# Patient Record
Sex: Male | Born: 1995 | Race: White | Hispanic: No | Marital: Single | Smoking: Never smoker
Health system: Southern US, Community
[De-identification: ages and names within clinical notes are randomized; demographics above are authoritative.]

## PROBLEM LIST (undated history)

## (undated) DIAGNOSIS — I499 Cardiac arrhythmia, unspecified: Secondary | ICD-10-CM

## (undated) DIAGNOSIS — F909 Attention-deficit hyperactivity disorder, unspecified type: Secondary | ICD-10-CM

## (undated) DIAGNOSIS — K649 Unspecified hemorrhoids: Secondary | ICD-10-CM

## (undated) DIAGNOSIS — S069X9A Unspecified intracranial injury with loss of consciousness of unspecified duration, initial encounter: Secondary | ICD-10-CM

## (undated) DIAGNOSIS — E785 Hyperlipidemia, unspecified: Secondary | ICD-10-CM

## (undated) DIAGNOSIS — Z9689 Presence of other specified functional implants: Secondary | ICD-10-CM

## (undated) DIAGNOSIS — I10 Essential (primary) hypertension: Secondary | ICD-10-CM

## (undated) DIAGNOSIS — F919 Conduct disorder, unspecified: Secondary | ICD-10-CM

## (undated) DIAGNOSIS — F4320 Adjustment disorder, unspecified: Secondary | ICD-10-CM

## (undated) DIAGNOSIS — R569 Unspecified convulsions: Secondary | ICD-10-CM

## (undated) HISTORY — DX: Cardiac arrhythmia, unspecified: I49.9

## (undated) HISTORY — DX: Presence of other specified functional implants: Z96.89

## (undated) HISTORY — DX: Essential (primary) hypertension: I10

## (undated) HISTORY — DX: Conduct disorder, unspecified: F91.9

## (undated) HISTORY — DX: Unspecified intracranial injury with loss of consciousness of unspecified duration, initial encounter: S06.9X9A

## (undated) HISTORY — DX: Adjustment disorder, unspecified: F43.20

## (undated) HISTORY — DX: Hyperlipidemia, unspecified: E78.5

## (undated) HISTORY — DX: Attention-deficit hyperactivity disorder, unspecified type: F90.9

## (undated) HISTORY — DX: Unspecified convulsions: R56.9

## (undated) HISTORY — DX: Unspecified hemorrhoids: K64.9

---
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
## 2016-01-04 ENCOUNTER — Encounter (HOSPITAL_COMMUNITY): Payer: Self-pay | Admitting: Psychiatry

## 2016-01-04 ENCOUNTER — Ambulatory Visit (INDEPENDENT_AMBULATORY_CARE_PROVIDER_SITE_OTHER): Payer: Medicaid Other | Admitting: Psychiatry

## 2016-01-04 VITALS — BP 112/76 | HR 65 | Ht 68.5 in | Wt 155.0 lb

## 2016-01-04 DIAGNOSIS — R569 Unspecified convulsions: Secondary | ICD-10-CM

## 2016-01-04 DIAGNOSIS — F902 Attention-deficit hyperactivity disorder, combined type: Secondary | ICD-10-CM | POA: Diagnosis not present

## 2016-01-04 MED ORDER — OXCARBAZEPINE 300 MG PO TABS: 300.0000 mg | ORAL_TABLET | Freq: Two times a day (BID) | ORAL | Status: DC

## 2016-01-04 MED ORDER — METHYLPHENIDATE HCL ER (OSM) 54 MG PO TBCR: 54.0000 mg | EXTENDED_RELEASE_TABLET | Freq: Every day | ORAL | Status: DC

## 2016-01-04 MED ORDER — CLONIDINE HCL 0.1 MG PO TABS: ORAL_TABLET | ORAL | Status: DC
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
## 2016-01-14 ENCOUNTER — Telehealth (HOSPITAL_COMMUNITY): Payer: Self-pay | Admitting: *Deleted

## 2016-01-14 DIAGNOSIS — F900 Attention-deficit hyperactivity disorder, predominantly inattentive type: Secondary | ICD-10-CM

## 2016-01-14 DIAGNOSIS — F902 Attention-deficit hyperactivity disorder, combined type: Secondary | ICD-10-CM

## 2016-01-14 NOTE — Telephone Encounter (Signed)
Father called stating that patient did not receive his Concerta refill during his last visit. Stating he needs refills of 54mg  and 36mg . Upon chart review Concerta 54mg  states it was printed on 01/04/16. Father states he needs the 36mg  script. Also states that he usually get a 3 month script so that it carries him to the next appointment. Asking for 2 more for the 54mg  and then 3 for the 36mg  to give to the patients pharmacy. Explained that MD has left for the day and will not be back until next week due to holiday weekend. Father states he has enough for the weekend.

## 2016-01-20 MED ORDER — METHYLPHENIDATE HCL ER (OSM) 54 MG PO TBCR: 54.0000 mg | EXTENDED_RELEASE_TABLET | Freq: Every day | ORAL | Status: DC

## 2016-01-20 MED ORDER — METHYLPHENIDATE HCL ER (OSM) 36 MG PO TBCR: 36.0000 mg | EXTENDED_RELEASE_TABLET | Freq: Every day | ORAL | Status: DC

## 2016-01-20 NOTE — Addendum Note (Signed)
Addended by: Georgian CoATKINS, Brittay Mogle E on: 01/20/2016 12:10 PM   Modules accepted: Orders

## 2016-01-20 NOTE — Telephone Encounter (Signed)
Dr. Rutherford Limerickadepalli gave me a verbal order to print 2 months worth. This was done and prescriptions were put up front

## 2016-01-21 ENCOUNTER — Telehealth (HOSPITAL_COMMUNITY): Payer: Self-pay

## 2016-01-21 NOTE — Telephone Encounter (Signed)
Tristan Diaz picked up prescription on 0/98/114/19/17 lic [id number] dlo

## 2016-03-22 ENCOUNTER — Other Ambulatory Visit (HOSPITAL_COMMUNITY): Payer: Self-pay

## 2016-03-22 ENCOUNTER — Telehealth (HOSPITAL_COMMUNITY): Payer: Self-pay

## 2016-03-22 DIAGNOSIS — F902 Attention-deficit hyperactivity disorder, combined type: Secondary | ICD-10-CM

## 2016-03-22 DIAGNOSIS — F900 Attention-deficit hyperactivity disorder, predominantly inattentive type: Secondary | ICD-10-CM

## 2016-03-22 MED ORDER — METHYLPHENIDATE HCL ER (OSM) 36 MG PO TBCR: 36.0000 mg | EXTENDED_RELEASE_TABLET | Freq: Every day | ORAL | Status: DC

## 2016-03-22 MED ORDER — METHYLPHENIDATE HCL ER (OSM) 54 MG PO TBCR: 54.0000 mg | EXTENDED_RELEASE_TABLET | Freq: Every day | ORAL | Status: DC

## 2016-03-22 NOTE — Telephone Encounter (Signed)
[name] picked up prescription on 1/61/096/20/17  lic  [phone]  dlo

## 2016-03-22 NOTE — Telephone Encounter (Signed)
Patient had to reschedule appointment, Dr. Ladona Ridgelaylor approved a one month order, I called patients mother to let her know that it was pretty.

## 2016-04-06 ENCOUNTER — Encounter (HOSPITAL_COMMUNITY): Payer: Self-pay | Admitting: Psychiatry

## 2016-04-06 ENCOUNTER — Ambulatory Visit (INDEPENDENT_AMBULATORY_CARE_PROVIDER_SITE_OTHER): Payer: Medicaid Other | Admitting: Psychiatry

## 2016-04-06 VITALS — BP 118/74 | HR 61 | Ht 69.0 in | Wt 164.2 lb

## 2016-04-06 DIAGNOSIS — F900 Attention-deficit hyperactivity disorder, predominantly inattentive type: Secondary | ICD-10-CM

## 2016-04-06 DIAGNOSIS — F902 Attention-deficit hyperactivity disorder, combined type: Secondary | ICD-10-CM | POA: Diagnosis not present

## 2016-04-06 DIAGNOSIS — Z79899 Other long term (current) drug therapy: Secondary | ICD-10-CM | POA: Diagnosis not present

## 2016-04-06 MED ORDER — CLONIDINE HCL 0.1 MG PO TABS: ORAL_TABLET | ORAL | Status: DC

## 2016-04-06 MED ORDER — OXCARBAZEPINE 300 MG PO TABS: 300.0000 mg | ORAL_TABLET | Freq: Two times a day (BID) | ORAL | Status: DC

## 2016-04-06 MED ORDER — METHYLPHENIDATE HCL ER (OSM) 54 MG PO TBCR: 54.0000 mg | EXTENDED_RELEASE_TABLET | Freq: Every day | ORAL | Status: DC

## 2016-04-06 MED ORDER — METHYLPHENIDATE HCL ER (OSM) 36 MG PO TBCR: 36.0000 mg | EXTENDED_RELEASE_TABLET | Freq: Every day | ORAL | Status: DC
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
## 2016-04-29 LAB — CBC WITH DIFFERENTIAL/PLATELET
Basophils Absolute: 0 cells/uL (ref 0–200)
Basophils Relative: 0 %
Eosinophils Absolute: 82 cells/uL (ref 15–500)
Eosinophils Relative: 1 %
HCT: 45.2 % (ref 36.0–49.0)
Hemoglobin: 15.5 g/dL (ref 12.0–16.9)
Lymphocytes Relative: 18 %
Lymphs Abs: 1476 cells/uL (ref 1200–5200)
MCH: 29.9 pg (ref 25.0–35.0)
MCHC: 34.3 g/dL (ref 31.0–36.0)
MCV: 87.1 fL (ref 78.0–98.0)
MPV: 9.4 fL (ref 7.5–12.5)
Monocytes Absolute: 574 cells/uL (ref 200–900)
Monocytes Relative: 7 %
Neutro Abs: 6068 cells/uL (ref 1800–8000)
Neutrophils Relative %: 74 %
Platelets: 281 10*3/uL (ref 140–400)
RBC: 5.19 MIL/uL (ref 4.10–5.70)
RDW: 13.3 % (ref 11.0–15.0)
WBC: 8.2 10*3/uL (ref 4.5–13.0)

## 2016-04-29 LAB — HEMOGLOBIN A1C
Hgb A1c MFr Bld: 5.3 % (ref <5.7)
Mean Plasma Glucose: 105 mg/dL

## 2016-04-30 LAB — COMPLETE METABOLIC PANEL WITH GFR
ALT: 17 U/L (ref 8–46)
AST: 17 U/L (ref 12–32)
Albumin: 4.4 g/dL (ref 3.6–5.1)
Alkaline Phosphatase: 142 U/L (ref 48–230)
BUN: 14 mg/dL (ref 7–20)
CO2: 27 mmol/L (ref 20–31)
Calcium: 9.9 mg/dL (ref 8.9–10.4)
Chloride: 104 mmol/L (ref 98–110)
Creat: 1.34 mg/dL — ABNORMAL HIGH (ref 0.60–1.26)
GFR, Est Non African American: 77 mL/min (ref >=60)
Glucose, Bld: 83 mg/dL (ref 65–99)
Potassium: 4.4 mmol/L (ref 3.8–5.1)
Sodium: 139 mmol/L (ref 135–146)
Total Bilirubin: 0.5 mg/dL (ref 0.2–1.1)
Total Protein: 6.8 g/dL (ref 6.3–8.2)

## 2016-04-30 LAB — TSH: TSH: 1.6 mIU/L (ref 0.50–4.30)

## 2016-05-12 ENCOUNTER — Telehealth (HOSPITAL_COMMUNITY): Payer: Self-pay

## 2016-05-12 DIAGNOSIS — F900 Attention-deficit hyperactivity disorder, predominantly inattentive type: Secondary | ICD-10-CM

## 2016-05-12 DIAGNOSIS — F902 Attention-deficit hyperactivity disorder, combined type: Secondary | ICD-10-CM

## 2016-05-12 MED ORDER — METHYLPHENIDATE HCL ER (OSM) 36 MG PO TBCR: 36.0000 mg | EXTENDED_RELEASE_TABLET | Freq: Every day | ORAL | 0 refills | Status: DC

## 2016-05-12 MED ORDER — METHYLPHENIDATE HCL ER (OSM) 54 MG PO TBCR: 54.0000 mg | EXTENDED_RELEASE_TABLET | Freq: Every day | ORAL | 0 refills | Status: DC

## 2016-05-12 NOTE — Telephone Encounter (Signed)
Yes we can refill 

## 2016-05-12 NOTE — Telephone Encounter (Signed)
Patient has been rescheduled with Dr. Lolly MustacheArfeen 10/5 - he needs a refill on his Concerta. Please review and advise, thank you

## 2016-05-12 NOTE — Telephone Encounter (Signed)
Printed and called patient to come and pick up 

## 2016-05-16 ENCOUNTER — Telehealth (HOSPITAL_COMMUNITY): Payer: Self-pay

## 2016-05-16 NOTE — Telephone Encounter (Signed)
Lora PaulaStephanie McMullen, step mother picked up prescription on 7/82/958/14/17  lic  [phone]  dlo

## 2016-06-17 ENCOUNTER — Other Ambulatory Visit (HOSPITAL_COMMUNITY): Payer: Self-pay

## 2016-06-17 DIAGNOSIS — F902 Attention-deficit hyperactivity disorder, combined type: Secondary | ICD-10-CM

## 2016-06-17 DIAGNOSIS — F900 Attention-deficit hyperactivity disorder, predominantly inattentive type: Secondary | ICD-10-CM

## 2016-06-17 MED ORDER — METHYLPHENIDATE HCL ER (OSM) 54 MG PO TBCR: 54.0000 mg | EXTENDED_RELEASE_TABLET | Freq: Every day | ORAL | 0 refills | Status: DC

## 2016-06-17 MED ORDER — METHYLPHENIDATE HCL ER (OSM) 36 MG PO TBCR: 36.0000 mg | EXTENDED_RELEASE_TABLET | Freq: Every day | ORAL | 0 refills | Status: DC

## 2016-06-21 ENCOUNTER — Telehealth (HOSPITAL_COMMUNITY): Payer: Self-pay

## 2016-06-21 NOTE — Telephone Encounter (Signed)
06/21/16 9:57am  Patient came and pick-up rx script.Marland Kitchen.[name]/sh ZO#[id number]

## 2016-06-26 ENCOUNTER — Other Ambulatory Visit (HOSPITAL_COMMUNITY): Payer: Self-pay | Admitting: Psychiatry

## 2016-06-26 DIAGNOSIS — F900 Attention-deficit hyperactivity disorder, predominantly inattentive type: Secondary | ICD-10-CM

## 2016-07-07 ENCOUNTER — Ambulatory Visit (HOSPITAL_COMMUNITY): Payer: Self-pay | Admitting: Psychiatry

## 2016-07-19 ENCOUNTER — Telehealth (HOSPITAL_COMMUNITY): Payer: Self-pay

## 2016-07-19 NOTE — Telephone Encounter (Signed)
Patient is calling for a refill on his Concerta, he has a follow up in December and would like enough until then. Okay to refill? Please review and advise, thank you

## 2016-07-20 ENCOUNTER — Other Ambulatory Visit (HOSPITAL_COMMUNITY): Payer: Self-pay | Admitting: Psychiatry

## 2016-07-20 DIAGNOSIS — F900 Attention-deficit hyperactivity disorder, predominantly inattentive type: Secondary | ICD-10-CM

## 2016-07-21 ENCOUNTER — Other Ambulatory Visit (HOSPITAL_COMMUNITY): Payer: Self-pay | Admitting: Psychiatry

## 2016-07-21 DIAGNOSIS — F900 Attention-deficit hyperactivity disorder, predominantly inattentive type: Secondary | ICD-10-CM

## 2016-07-21 MED ORDER — METHYLPHENIDATE HCL ER (OSM) 36 MG PO TBCR: 36.0000 mg | EXTENDED_RELEASE_TABLET | Freq: Every day | ORAL | 0 refills | Status: DC

## 2016-07-21 NOTE — Telephone Encounter (Signed)
Prescription printed and signed by Dr. Lolly MustacheArfeen, I called patient and let him know to come and pick up. I also advised patient to make a new appointment, Dr Lolly MustacheArfeen wants to see him before December

## 2016-07-22 ENCOUNTER — Telehealth (HOSPITAL_COMMUNITY): Payer: Self-pay

## 2016-07-22 NOTE — Telephone Encounter (Signed)
07/22/16 10:27am Patient came and pick-up rx script WU#[id number].Marland Kitchen.Tristan Diaz/sh

## 2016-07-26 ENCOUNTER — Ambulatory Visit (INDEPENDENT_AMBULATORY_CARE_PROVIDER_SITE_OTHER): Payer: Medicaid Other | Admitting: Psychiatry

## 2016-07-26 ENCOUNTER — Encounter (HOSPITAL_COMMUNITY): Payer: Self-pay | Admitting: Psychiatry

## 2016-07-26 VITALS — BP 116/72 | HR 79 | Ht 69.0 in | Wt 167.8 lb

## 2016-07-26 DIAGNOSIS — F900 Attention-deficit hyperactivity disorder, predominantly inattentive type: Secondary | ICD-10-CM

## 2016-07-26 DIAGNOSIS — Z79899 Other long term (current) drug therapy: Secondary | ICD-10-CM | POA: Diagnosis not present

## 2016-07-26 DIAGNOSIS — Z818 Family history of other mental and behavioral disorders: Secondary | ICD-10-CM | POA: Diagnosis not present

## 2016-07-26 MED ORDER — METHYLPHENIDATE HCL ER (OSM) 54 MG PO TBCR: 54.0000 mg | EXTENDED_RELEASE_TABLET | Freq: Every day | ORAL | 0 refills | Status: DC

## 2016-07-26 MED ORDER — OXCARBAZEPINE 300 MG PO TABS: 300.0000 mg | ORAL_TABLET | Freq: Two times a day (BID) | ORAL | 2 refills | Status: AC

## 2016-07-26 MED ORDER — CLONIDINE HCL 0.1 MG PO TABS: ORAL_TABLET | ORAL | 0 refills | Status: DC
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
## 2016-09-06 ENCOUNTER — Ambulatory Visit (HOSPITAL_COMMUNITY): Payer: Self-pay | Admitting: Psychiatry

## 2016-10-18 ENCOUNTER — Ambulatory Visit (HOSPITAL_COMMUNITY): Payer: Self-pay | Admitting: Psychiatry

## 2016-11-15 ENCOUNTER — Other Ambulatory Visit (HOSPITAL_COMMUNITY): Payer: Self-pay | Admitting: Psychiatry

## 2016-11-15 DIAGNOSIS — F900 Attention-deficit hyperactivity disorder, predominantly inattentive type: Secondary | ICD-10-CM

## 2016-12-02 DIAGNOSIS — Z713 Dietary counseling and surveillance: Secondary | ICD-10-CM | POA: Diagnosis not present

## 2016-12-22 DIAGNOSIS — G40211 Localization-related (focal) (partial) symptomatic epilepsy and epileptic syndromes with complex partial seizures, intractable, with status epilepticus: Secondary | ICD-10-CM | POA: Diagnosis not present

## 2017-02-14 DIAGNOSIS — G40219 Localization-related (focal) (partial) symptomatic epilepsy and epileptic syndromes with complex partial seizures, intractable, without status epilepticus: Secondary | ICD-10-CM | POA: Diagnosis not present

## 2017-03-06 DIAGNOSIS — Z4542 Encounter for adjustment and management of neuropacemaker (brain) (peripheral nerve) (spinal cord): Secondary | ICD-10-CM | POA: Diagnosis not present

## 2017-03-06 DIAGNOSIS — T84428A Displacement of other internal orthopedic devices, implants and grafts, initial encounter: Secondary | ICD-10-CM | POA: Diagnosis not present

## 2017-03-06 DIAGNOSIS — Z79899 Other long term (current) drug therapy: Secondary | ICD-10-CM | POA: Diagnosis not present

## 2017-03-06 DIAGNOSIS — K219 Gastro-esophageal reflux disease without esophagitis: Secondary | ICD-10-CM | POA: Diagnosis not present

## 2017-03-06 DIAGNOSIS — F909 Attention-deficit hyperactivity disorder, unspecified type: Secondary | ICD-10-CM | POA: Diagnosis not present

## 2017-03-06 DIAGNOSIS — G40219 Localization-related (focal) (partial) symptomatic epilepsy and epileptic syndromes with complex partial seizures, intractable, without status epilepticus: Secondary | ICD-10-CM | POA: Diagnosis not present

## 2017-03-06 DIAGNOSIS — I10 Essential (primary) hypertension: Secondary | ICD-10-CM | POA: Diagnosis not present

## 2017-03-08 DIAGNOSIS — G40211 Localization-related (focal) (partial) symptomatic epilepsy and epileptic syndromes with complex partial seizures, intractable, with status epilepticus: Secondary | ICD-10-CM | POA: Diagnosis not present

## 2017-03-10 DIAGNOSIS — H5213 Myopia, bilateral: Secondary | ICD-10-CM | POA: Diagnosis not present

## 2017-03-23 DIAGNOSIS — R05 Cough: Secondary | ICD-10-CM | POA: Diagnosis not present

## 2017-03-23 DIAGNOSIS — Z6822 Body mass index (BMI) 22.0-22.9, adult: Secondary | ICD-10-CM | POA: Diagnosis not present

## 2017-03-23 DIAGNOSIS — J209 Acute bronchitis, unspecified: Secondary | ICD-10-CM | POA: Diagnosis not present

## 2017-04-04 DIAGNOSIS — G40211 Localization-related (focal) (partial) symptomatic epilepsy and epileptic syndromes with complex partial seizures, intractable, with status epilepticus: Secondary | ICD-10-CM | POA: Diagnosis not present

## 2017-04-06 DIAGNOSIS — Z6823 Body mass index (BMI) 23.0-23.9, adult: Secondary | ICD-10-CM | POA: Diagnosis not present

## 2017-04-06 DIAGNOSIS — R05 Cough: Secondary | ICD-10-CM | POA: Diagnosis not present

## 2017-05-09 DIAGNOSIS — R002 Palpitations: Secondary | ICD-10-CM | POA: Diagnosis not present

## 2017-05-09 DIAGNOSIS — R42 Dizziness and giddiness: Secondary | ICD-10-CM | POA: Diagnosis not present

## 2017-05-10 DIAGNOSIS — R002 Palpitations: Secondary | ICD-10-CM | POA: Insufficient documentation

## 2017-05-10 DIAGNOSIS — R42 Dizziness and giddiness: Secondary | ICD-10-CM | POA: Insufficient documentation

## 2017-05-29 ENCOUNTER — Encounter (HOSPITAL_COMMUNITY): Payer: Self-pay | Admitting: Emergency Medicine

## 2017-05-29 ENCOUNTER — Emergency Department (HOSPITAL_COMMUNITY): Payer: Medicaid Other

## 2017-05-29 ENCOUNTER — Emergency Department (HOSPITAL_COMMUNITY)
Admission: EM | Admit: 2017-05-29 | Discharge: 2017-05-29 | Disposition: A | Discharge status: Home / Self Care | Payer: Medicaid Other | Attending: Emergency Medicine | Admitting: Emergency Medicine

## 2017-05-29 ENCOUNTER — Other Ambulatory Visit: Payer: Self-pay

## 2017-05-29 DIAGNOSIS — R0602 Shortness of breath: Secondary | ICD-10-CM | POA: Diagnosis not present

## 2017-05-29 DIAGNOSIS — R000 Tachycardia, unspecified: Secondary | ICD-10-CM

## 2017-05-29 DIAGNOSIS — Z79899 Other long term (current) drug therapy: Secondary | ICD-10-CM | POA: Insufficient documentation

## 2017-05-29 DIAGNOSIS — R002 Palpitations: Secondary | ICD-10-CM | POA: Diagnosis present

## 2017-05-29 DIAGNOSIS — I10 Essential (primary) hypertension: Secondary | ICD-10-CM | POA: Diagnosis not present

## 2017-05-29 DIAGNOSIS — R079 Chest pain, unspecified: Secondary | ICD-10-CM | POA: Diagnosis not present

## 2017-05-29 LAB — BASIC METABOLIC PANEL
Anion gap: 8 (ref 5–15)
BUN: 11 mg/dL (ref 6–20)
CO2: 28 mmol/L (ref 22–32)
Calcium: 9.9 mg/dL (ref 8.9–10.3)
Chloride: 103 mmol/L (ref 101–111)
Creatinine, Ser: 1.18 mg/dL (ref 0.61–1.24)
GFR calc Af Amer: >60 mL/min (ref >60)
GFR calc non Af Amer: >60 mL/min (ref >60)
Glucose, Bld: 115 mg/dL — ABNORMAL HIGH (ref 65–99)
Potassium: 4.5 mmol/L (ref 3.5–5.1)
Sodium: 139 mmol/L (ref 135–145)

## 2017-05-29 LAB — CBC
HCT: 46.8 % (ref 39.0–52.0)
Hemoglobin: 16.3 g/dL (ref 13.0–17.0)
MCH: 30.8 pg (ref 26.0–34.0)
MCHC: 34.8 g/dL (ref 30.0–36.0)
MCV: 88.5 fL (ref 78.0–100.0)
Platelets: 258 10*3/uL (ref 150–400)
RBC: 5.29 MIL/uL (ref 4.22–5.81)
RDW: 12.3 % (ref 11.5–15.5)
WBC: 7.6 10*3/uL (ref 4.0–10.5)

## 2017-05-29 LAB — I-STAT TROPONIN, ED: Troponin i, poc: 0 ng/mL (ref 0.00–0.08)

## 2017-05-29 MED ORDER — CLONIDINE HCL 0.1 MG PO TABS
0.1000 mg | ORAL_TABLET | Freq: Once | ORAL | Status: AC
  Administered 2017-05-29: 0.1 mg via ORAL
  Filled 2017-05-29: qty 1
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
## 2017-05-29 NOTE — ED Triage Notes (Signed)
Pt to ER for acute onset of palpitations around noon while mowing the lawn. Reports dizziness as well. NAD at present. A/o x4.

## 2017-05-29 NOTE — ED Notes (Signed)
Pt states has been being monitored by cardiologist for these episodes where he feels dizzy and feels like his heart is racing, wore a monitor last week and is waiting to hear the results from that. Today sought care because this episode did not go away when he removed himself from the heat.

## 2017-05-29 NOTE — ED Notes (Signed)
MD Ray made aware of pt's HR 90-112.

## 2017-05-29 NOTE — Discharge Instructions (Signed)
Call cardiologist for recheck. Do not exert yourself or be exposed to heat until rechecked.

## 2017-06-06 DIAGNOSIS — R42 Dizziness and giddiness: Secondary | ICD-10-CM | POA: Diagnosis not present

## 2017-06-16 ENCOUNTER — Encounter: Payer: Self-pay | Admitting: Internal Medicine

## 2017-06-27 ENCOUNTER — Encounter: Payer: Self-pay | Admitting: Internal Medicine

## 2017-06-27 ENCOUNTER — Ambulatory Visit (INDEPENDENT_AMBULATORY_CARE_PROVIDER_SITE_OTHER): Payer: BLUE CROSS/BLUE SHIELD | Admitting: Internal Medicine

## 2017-06-27 VITALS — BP 122/68 | HR 79 | Ht 69.0 in | Wt 166.8 lb

## 2017-06-27 DIAGNOSIS — I471 Supraventricular tachycardia: Secondary | ICD-10-CM | POA: Diagnosis not present

## 2017-06-27 MED ORDER — DILTIAZEM HCL ER COATED BEADS 180 MG PO TB24: 180.0000 mg | ORAL_TABLET | Freq: Every day | ORAL | 3 refills | Status: DC

## 2017-06-27 NOTE — Patient Instructions (Signed)
Medication Instructions:  Your physician has recommended you make the following change in your medication:  1.  Stop clonidine.   2.  Start taking Cardizem ER 180 mg (one capsule) by mouth daily.  Labwork: None ordered.  Testing/Procedures: None ordered.  Follow-Up: Your physician wants you to follow-up in: 3-4 months with Dr. Ladona Ridgel.    Any Other Special Instructions Will Be Listed Below (If Applicable).  Diltiazem extended-release capsules or tablets What is this medicine? DILTIAZEM (dil TYE a zem) is a calcium-channel blocker. It affects the amount of calcium found in your heart and muscle cells. This relaxes your blood vessels, which can reduce the amount of work the heart has to do. This medicine is used to treat high blood pressure and chest pain caused by angina. This medicine may be used for other purposes; ask your health care provider or pharmacist if you have questions. COMMON BRAND NAME(S): Cardizem CD, Cardizem LA, Cardizem SR, Cartia XT, Dilacor XR, Dilt-CD, Diltia XT, Diltzac, Matzim LA, Verna Czech, Tiamate, Tiazac What should I tell my health care provider before I take this medicine? They need to know if you have any of these conditions: -heart problems, low blood pressure, irregular heartbeat -liver disease -previous heart attack -an unusual or allergic reaction to diltiazem, other medicines, foods, dyes, or preservatives -pregnant or trying to get pregnant -breast-feeding How should I use this medicine? Take this medicine by mouth with a glass of water. Follow the directions on the prescription label. Swallow whole, do not crush or chew. Ask your doctor or pharmacist if your should take this medicine with food. Take your doses at regular intervals. Do not take your medicine more often then directed. Do not stop taking except on the advice of your doctor or health care professional. Ask your doctor or health care professional how to gradually reduce the dose. Talk to  your pediatrician regarding the use of this medicine in children. Special care may be needed. Overdosage: If you think you have taken too much of this medicine contact a poison control center or emergency room at once. NOTE: This medicine is only for you. Do not share this medicine with others. What if I miss a dose? If you miss a dose, take it as soon as you can. If it is almost time for your next dose, take only that dose. Do not take double or extra doses. What may interact with this medicine? Do not take this medicine with any of the following medications: -cisapride -hawthorn -pimozide -ranolazine -red yeast rice This medicine may also interact with the following medications: -buspirone -carbamazepine -cimetidine -cyclosporine -digoxin -local anesthetics or general anesthetics -lovastatin -medicines for anxiety or difficulty sleeping like midazolam and triazolam -medicines for high blood pressure or heart problems -quinidine -rifampin, rifabutin, or rifapentine This list may not describe all possible interactions. Give your health care provider a list of all the medicines, herbs, non-prescription drugs, or dietary supplements you use. Also tell them if you smoke, drink alcohol, or use illegal drugs. Some items may interact with your medicine. What should I watch for while using this medicine? Check your blood pressure and pulse rate regularly. Ask your doctor or health care professional what your blood pressure and pulse rate should be and when you should contact him or her. You may feel dizzy or lightheaded. Do not drive, use machinery, or do anything that needs mental alertness until you know how this medicine affects you. To reduce the risk of dizzy or fainting spells, do  not sit or stand up quickly, especially if you are an older patient. Alcohol can make you more dizzy or increase flushing and rapid heartbeats. Avoid alcoholic drinks. What side effects may I notice from  receiving this medicine? Side effects that you should report to your doctor or health care professional as soon as possible: -allergic reactions like skin rash, itching or hives, swelling of the face, lips, or tongue -confusion, mental depression -feeling faint or lightheaded, falls -redness, blistering, peeling or loosening of the skin, including inside the mouth -slow, irregular heartbeat -swelling of the feet and ankles -unusual bleeding or bruising, pinpoint red spots on the skin Side effects that usually do not require medical attention (report to your doctor or health care professional if they continue or are bothersome): -constipation or diarrhea -difficulty sleeping -facial flushing -headache -nausea, vomiting -sexual dysfunction -weak or tired This list may not describe all possible side effects. Call your doctor for medical advice about side effects. You may report side effects to FDA at 1-800-FDA-1088. Where should I keep my medicine? Keep out of the reach of children. Store at room temperature between 15 and 30 degrees C (59 and 86 degrees F). Protect from humidity. Throw away any unused medicine after the expiration date. NOTE: This sheet is a summary. It may not cover all possible information. If you have questions about this medicine, talk to your doctor, pharmacist, or health care provider.  2018 Elsevier/Gold Standard (2008-01-10 14:35:47)   If you need a refill on your cardiac medications before your next appointment, please call your pharmacy.
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
## 2017-06-28 ENCOUNTER — Other Ambulatory Visit: Payer: Self-pay

## 2017-06-28 MED ORDER — DILTIAZEM HCL ER COATED BEADS 180 MG PO CP24: 180.0000 mg | ORAL_CAPSULE | Freq: Every day | ORAL | 3 refills | Status: DC

## 2017-06-28 NOTE — Progress Notes (Signed)
Received notice from pharmacy that ordered cardizem was on back order.  Changed to different comparable cardizem.  Sent to pharmacy.

## 2017-07-17 ENCOUNTER — Telehealth: Payer: Self-pay | Admitting: Internal Medicine

## 2017-07-17 NOTE — Telephone Encounter (Signed)
Tristan Diaz is calling because his heart rate is still racing and having irregular heart beat as well.  Was having some chest pains when he first started taking the medication about 3 days ago. Please call

## 2017-07-18 NOTE — Telephone Encounter (Signed)
Spoke with Pt.  Per Pt he would rather not have any further tests until he speaks with Dr. Ladona Ridgel.  Would like an appt when he is next available.  Pt states chest pain happened last month when he first started Cardizem, has had no further chest pain, just heart racing off and on.  Will send to scheduling.

## 2017-07-18 NOTE — Telephone Encounter (Signed)
Call returned to Pt.  Pt not available, spoke with Pt father per DPR.  Per father, Pt had an episode of heart racing last [place] the [place].  Father unsure about any continued chest pain.  Per father Pt has been checking his BP/pulse and pulse has been labile (no actual rates given).  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  Discussed with father that we would like to have Pt wear a 48 hour monitor.  Father states he will message Pt, he is working today.  Gave this nurse name and # for Pt to call.  Await call back.

## 2017-07-19 ENCOUNTER — Telehealth: Payer: Self-pay | Admitting: Internal Medicine

## 2017-07-19 NOTE — Telephone Encounter (Signed)
Dr. Lubertha Basqueaylor's scheduler called this patient to give him an appointment.

## 2017-07-19 NOTE — Telephone Encounter (Signed)
New Message ° ° pt verbalized that he is returning call for rn  °

## 2017-07-26 ENCOUNTER — Encounter: Payer: Self-pay | Admitting: Internal Medicine

## 2017-07-26 ENCOUNTER — Ambulatory Visit (INDEPENDENT_AMBULATORY_CARE_PROVIDER_SITE_OTHER): Payer: BLUE CROSS/BLUE SHIELD | Admitting: Internal Medicine

## 2017-07-26 VITALS — BP 118/60 | HR 76 | Ht 69.0 in | Wt 162.0 lb

## 2017-07-26 DIAGNOSIS — I471 Supraventricular tachycardia: Secondary | ICD-10-CM
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
## 2017-07-26 NOTE — Patient Instructions (Addendum)
Medication Instructions:  Your physician recommends that you continue on your current medications as directed. Please refer to the Current Medication list given to you today.  Labwork: None ordered.  Testing/Procedures: Your physician has requested that you have an exercise tolerance test. For further information please visit [email or web address]. Please also follow instruction sheet, as given.  Please schedule patient for an exercise tolerance test on one of the following days: October 26, 30, 31.  Follow-Up: Your physician wants you to follow-up based on results of exercise tolerance test.  Any Other Special Instructions Will Be Listed Below (If Applicable).    If you need a refill on your cardiac medications before your next appointment, please call your pharmacy.

## 2017-07-27 DIAGNOSIS — Z23 Encounter for immunization: Secondary | ICD-10-CM | POA: Diagnosis not present

## 2017-08-01 ENCOUNTER — Ambulatory Visit (INDEPENDENT_AMBULATORY_CARE_PROVIDER_SITE_OTHER): Payer: BLUE CROSS/BLUE SHIELD

## 2017-08-01 DIAGNOSIS — I471 Supraventricular tachycardia: Secondary | ICD-10-CM | POA: Diagnosis not present

## 2017-08-02 LAB — EXERCISE TOLERANCE TEST
Estimated workload: 13.7 METS
Exercise duration (min): 11 min
Exercise duration (sec): 47 s
MPHR: 201 {beats}/min
Peak HR: 181 {beats}/min
Percent HR: 90 %
RPE: 17
Rest HR: 93 {beats}/min

## 2017-08-04 ENCOUNTER — Telehealth: Payer: Self-pay

## 2017-08-04 NOTE — Telephone Encounter (Signed)
Pt calling regarding results of recent POET.  Left detailed message for Pt notifying him that Dr. Ladona Ridgelaylor had not interpreted results yet, will call as soon as Dr. Ladona Ridgelaylor dictates on test.

## 2017-08-15 DIAGNOSIS — F909 Attention-deficit hyperactivity disorder, unspecified type: Secondary | ICD-10-CM | POA: Diagnosis not present

## 2017-08-15 DIAGNOSIS — R300 Dysuria: Secondary | ICD-10-CM | POA: Diagnosis not present

## 2017-08-15 DIAGNOSIS — R569 Unspecified convulsions: Secondary | ICD-10-CM | POA: Diagnosis not present

## 2017-08-15 DIAGNOSIS — Z79899 Other long term (current) drug therapy: Secondary | ICD-10-CM | POA: Diagnosis not present

## 2017-08-15 DIAGNOSIS — I10 Essential (primary) hypertension: Secondary | ICD-10-CM | POA: Diagnosis not present

## 2017-08-15 DIAGNOSIS — R002 Palpitations: Secondary | ICD-10-CM | POA: Diagnosis not present

## 2017-08-15 DIAGNOSIS — R61 Generalized hyperhidrosis: Secondary | ICD-10-CM | POA: Diagnosis not present

## 2017-08-23 DIAGNOSIS — I10 Essential (primary) hypertension: Secondary | ICD-10-CM | POA: Diagnosis not present

## 2017-09-18 NOTE — Telephone Encounter (Signed)
Pt notified of test results (see exercise stress test results from 07/2017).  Results WNL.  Will have Pt f/u 4 months s/p stress test to discuss with Dr. Ladona Ridgelaylor and review current meds.

## 2017-10-02 DIAGNOSIS — F72 Severe intellectual disabilities: Secondary | ICD-10-CM | POA: Diagnosis not present

## 2017-10-02 DIAGNOSIS — R9401 Abnormal electroencephalogram [EEG]: Secondary | ICD-10-CM | POA: Diagnosis not present

## 2017-10-02 DIAGNOSIS — G40211 Localization-related (focal) (partial) symptomatic epilepsy and epileptic syndromes with complex partial seizures, intractable, with status epilepticus: Secondary | ICD-10-CM | POA: Diagnosis not present

## 2017-11-10 DIAGNOSIS — I10 Essential (primary) hypertension: Secondary | ICD-10-CM | POA: Diagnosis not present

## 2017-11-10 DIAGNOSIS — Z0000 Encounter for general adult medical examination without abnormal findings: Secondary | ICD-10-CM | POA: Diagnosis not present

## 2017-11-10 DIAGNOSIS — Z713 Dietary counseling and surveillance: Secondary | ICD-10-CM | POA: Diagnosis not present

## 2017-11-17 DIAGNOSIS — R002 Palpitations: Secondary | ICD-10-CM | POA: Diagnosis not present

## 2017-11-17 DIAGNOSIS — Z1389 Encounter for screening for other disorder: Secondary | ICD-10-CM | POA: Diagnosis not present

## 2017-11-17 DIAGNOSIS — I10 Essential (primary) hypertension: Secondary | ICD-10-CM | POA: Diagnosis not present

## 2017-11-17 DIAGNOSIS — F900 Attention-deficit hyperactivity disorder, predominantly inattentive type: Secondary | ICD-10-CM | POA: Diagnosis not present

## 2017-11-17 DIAGNOSIS — R05 Cough: Secondary | ICD-10-CM | POA: Diagnosis not present

## 2017-11-17 DIAGNOSIS — Z0000 Encounter for general adult medical examination without abnormal findings: Secondary | ICD-10-CM | POA: Diagnosis not present

## 2018-01-05 ENCOUNTER — Ambulatory Visit: Payer: BLUE CROSS/BLUE SHIELD | Admitting: Internal Medicine

## 2018-01-31 DIAGNOSIS — Z713 Dietary counseling and surveillance: Secondary | ICD-10-CM | POA: Diagnosis not present

## 2018-03-13 ENCOUNTER — Telehealth: Payer: Self-pay | Admitting: *Deleted

## 2018-03-13 NOTE — Telephone Encounter (Addendum)
Patient left voice mail message asking when his appointment is with Dr Ladona Ridgelaylor. Patient also stated in message that his heart rate had been low, in the 40s, and states he has not been taking his BP medication because of low heart rate.   Patient requesting call back.  I will forward to [place] ShoreJenny, Charity fundraiserN.

## 2018-03-14 NOTE — Telephone Encounter (Signed)
Spoke with father per DPR.   Pt is taking diltiazem and enalapril in the am, but holding his pm enalapril dose d/t low blood pressure and heart rate. Father also states when he takes his blood pressure at night the machine says Pt heart rate is irregular. Advised to have Pt continue to take his medications as he has been, holding with low bp/heart rate and f/u appt is June 28.   Father indicates understanding.

## 2018-03-30 ENCOUNTER — Telehealth: Payer: Self-pay | Admitting: Internal Medicine

## 2018-03-30 ENCOUNTER — Encounter: Payer: Self-pay | Admitting: Internal Medicine

## 2018-03-30 ENCOUNTER — Ambulatory Visit (INDEPENDENT_AMBULATORY_CARE_PROVIDER_SITE_OTHER): Payer: BLUE CROSS/BLUE SHIELD | Admitting: Internal Medicine

## 2018-03-30 VITALS — BP 142/84 | HR 67 | Ht 69.0 in | Wt 167.0 lb

## 2018-03-30 DIAGNOSIS — I471 Supraventricular tachycardia: Secondary | ICD-10-CM

## 2018-03-30 DIAGNOSIS — I519 Heart disease, unspecified: Secondary | ICD-10-CM

## 2018-03-30 MED ORDER — METOPROLOL SUCCINATE ER 25 MG PO TB24: 25.0000 mg | ORAL_TABLET | Freq: Every day | ORAL | 0 refills | Status: DC

## 2018-03-30 MED ORDER — METOPROLOL SUCCINATE ER 50 MG PO TB24: 50.0000 mg | ORAL_TABLET | Freq: Every day | ORAL | 3 refills | Status: DC
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
## 2018-03-30 NOTE — Patient Instructions (Addendum)
Medication Instructions:  Your physician has recommended you make the following change in your medication: 1.  Stop taking diltiazem  2.  Start taking Toprol XL 25 mg one tablet by mouth daily for 2 weeks THEN  3.  Start taking Toprol XL 50 mg one tablet by mouth daily.   Labwork: None ordered.  Testing/Procedures: Your physician has requested that you have an echocardiogram. Echocardiography is a painless test that uses sound waves to create images of your heart. It provides your doctor with information about the size and shape of your heart and how well your heart's chambers and valves are working. This procedure takes approximately one hour. There are no restrictions for this procedure.  Please schedule for an ECHO  Follow-Up: Your physician wants you to follow-up in: 3 months with Dr. Ladona Ridgelaylor.      Any Other Special Instructions Will Be Listed Below (If Applicable).  If you need a refill on your cardiac medications before your next appointment, please call your pharmacy.

## 2018-03-30 NOTE — Telephone Encounter (Signed)
Left VM per DPR.  Advised Pt father of new instructions.  Left this nurse name and # if any further questions.
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
## 2018-04-10 ENCOUNTER — Ambulatory Visit (HOSPITAL_COMMUNITY): Payer: BLUE CROSS/BLUE SHIELD | Attending: Cardiovascular Disease

## 2018-04-10 ENCOUNTER — Other Ambulatory Visit: Payer: Self-pay

## 2018-04-10 DIAGNOSIS — I341 Nonrheumatic mitral (valve) prolapse: Secondary | ICD-10-CM | POA: Insufficient documentation

## 2018-04-10 DIAGNOSIS — I519 Heart disease, unspecified: Secondary | ICD-10-CM

## 2018-04-10 DIAGNOSIS — I340 Nonrheumatic mitral (valve) insufficiency: Secondary | ICD-10-CM | POA: Diagnosis not present

## 2018-04-10 DIAGNOSIS — I471 Supraventricular tachycardia: Secondary | ICD-10-CM | POA: Diagnosis not present

## 2018-04-10 DIAGNOSIS — I119 Hypertensive heart disease without heart failure: Secondary | ICD-10-CM | POA: Diagnosis not present

## 2018-05-07 ENCOUNTER — Other Ambulatory Visit: Payer: Self-pay | Admitting: Internal Medicine

## 2018-05-08 NOTE — Telephone Encounter (Signed)
Please advise on refill request as per original rx and office note patient was only to take this dose for two weeks. Thanks, MI

## 2018-05-28 ENCOUNTER — Telehealth: Payer: Self-pay | Admitting: Internal Medicine

## 2018-05-28 NOTE — Telephone Encounter (Signed)
New Message:   Pt c/o medication issue:  1. Name of Medication:metoprolol succinate (TOPROL XL) 25 MG 24 hr tablet  2. How are you currently taking this medication (dosage and times per day)? Take 1 tablet (25 mg total) by mouth daily. Take daily for 2 weeks then change to Toprol XL 50 mg tabs  3. Are you having a reaction (difficulty breathing--STAT)? No   4. What is your medication issue? Pt father has a question about the dosage

## 2018-05-29 NOTE — Telephone Encounter (Signed)
Call returned to Pt's father.  Confirmed Pt should be taking Toprol XL 50 mg one tablet daily.  Advised it does not matter if Pt takes in the morning or the evening.  Father asked if Pt was supposed to be taking diltiazem.  Advised that medication was discontinued at last OV.  Father asked if ECHO showed anything.   Advised OK was normal.  Father reports one episode of Pt heart rate getting to 180's.  Advised to continue to monitor.  Father thanked Engineer, civil (consulting)nurse for call.

## 2018-06-07 ENCOUNTER — Telehealth: Payer: Self-pay | Admitting: Internal Medicine

## 2018-06-07 NOTE — Telephone Encounter (Signed)
Spoke to patient's father who said that the Metoprolol Succinate has been causing the patient headaches and vomiting.  They have stopped the metoprolol and restarted Diltiazem for now until further advised.    The patient's HR at one point was 174-181 bpm and the father had the patient splash cold water on his face, which brought the rate down to 70 bpm.  This resulted in the time period: after the stoppage of Metoprolol and start of Diltiazem.

## 2018-06-07 NOTE — Telephone Encounter (Signed)
New message   Pt c/o medication issue:  1. Name of Medication:   metoprolol succinate (TOPROL XL) 50 MG 24 hr tablet     2. How are you currently taking this medication (dosage and times per day)? Once daily in morning  3. Are you having a reaction (difficulty breathing--STAT)?   4. What is your medication issue? Patients father states that the patient had severe headaches, vomiting with this medication. Patient's father states that he put him back on Diliazem. Please call to discuss.

## 2018-06-08 NOTE — Telephone Encounter (Signed)
Returned call to father.  Advised it was ok he made medication change d/t Pt unable to tolerate metoprolol.  Advised would discuss with Dr. Ladona Ridgel and see what next plan would be.    Will cont to monitor.

## 2018-06-10 NOTE — Telephone Encounter (Signed)
Try atenolol 25 mg daily.

## 2018-06-12 ENCOUNTER — Telehealth: Payer: Self-pay

## 2018-06-12 MED ORDER — ATENOLOL 25 MG PO TABS: 25.0000 mg | ORAL_TABLET | Freq: Every day | ORAL | 11 refills | Status: DC

## 2018-06-12 NOTE — Telephone Encounter (Signed)
CVM left for father per DPR.  Advised per Dr. Ladona Ridgel to stop diltiazem and try atenolol 25 mg one tablet po daily.  Advised sending to pharmacy.  Advised to call this nurse if Pt has trouble with this medication.

## 2018-06-19 DIAGNOSIS — Z23 Encounter for immunization: Secondary | ICD-10-CM | POA: Diagnosis not present

## 2018-06-20 ENCOUNTER — Encounter: Payer: Self-pay | Admitting: *Deleted

## 2018-06-20 DIAGNOSIS — F909 Attention-deficit hyperactivity disorder, unspecified type: Secondary | ICD-10-CM | POA: Insufficient documentation

## 2018-07-03 ENCOUNTER — Encounter: Payer: Self-pay | Admitting: Internal Medicine

## 2018-07-03 ENCOUNTER — Ambulatory Visit (INDEPENDENT_AMBULATORY_CARE_PROVIDER_SITE_OTHER): Payer: BLUE CROSS/BLUE SHIELD | Admitting: Internal Medicine

## 2018-07-03 VITALS — BP 136/74 | HR 64 | Ht 69.0 in | Wt 169.0 lb

## 2018-07-03 DIAGNOSIS — R002 Palpitations: Secondary | ICD-10-CM

## 2018-07-03 NOTE — Patient Instructions (Signed)
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
## 2018-09-04 DIAGNOSIS — Z713 Dietary counseling and surveillance: Secondary | ICD-10-CM | POA: Diagnosis not present

## 2018-10-12 DIAGNOSIS — Z713 Dietary counseling and surveillance: Secondary | ICD-10-CM | POA: Diagnosis not present

## 2018-12-27 DIAGNOSIS — E7849 Other hyperlipidemia: Secondary | ICD-10-CM | POA: Diagnosis not present

## 2018-12-27 DIAGNOSIS — F900 Attention-deficit hyperactivity disorder, predominantly inattentive type: Secondary | ICD-10-CM | POA: Diagnosis not present

## 2018-12-27 DIAGNOSIS — Z0000 Encounter for general adult medical examination without abnormal findings: Secondary | ICD-10-CM | POA: Diagnosis not present

## 2018-12-27 DIAGNOSIS — G40909 Epilepsy, unspecified, not intractable, without status epilepticus: Secondary | ICD-10-CM | POA: Diagnosis not present

## 2018-12-27 DIAGNOSIS — Z23 Encounter for immunization: Secondary | ICD-10-CM | POA: Diagnosis not present

## 2018-12-27 DIAGNOSIS — E559 Vitamin D deficiency, unspecified: Secondary | ICD-10-CM | POA: Diagnosis not present

## 2018-12-27 DIAGNOSIS — R002 Palpitations: Secondary | ICD-10-CM | POA: Diagnosis not present

## 2018-12-27 DIAGNOSIS — I10 Essential (primary) hypertension: Secondary | ICD-10-CM | POA: Diagnosis not present

## 2018-12-27 DIAGNOSIS — Z1331 Encounter for screening for depression: Secondary | ICD-10-CM | POA: Diagnosis not present

## 2019-01-24 IMAGING — CR DG CHEST 2V
2 series · 2 of 2 positions shown · non-contrast
Comparison: 12/15/2009

CLINICAL DATA: Chest pain, short of breath and weakness since 8431
hours, history hypertension, vagus nerve stimulator placement,
unspecified heart condition

EXAM:
CHEST  2 VIEW

[chest lat]
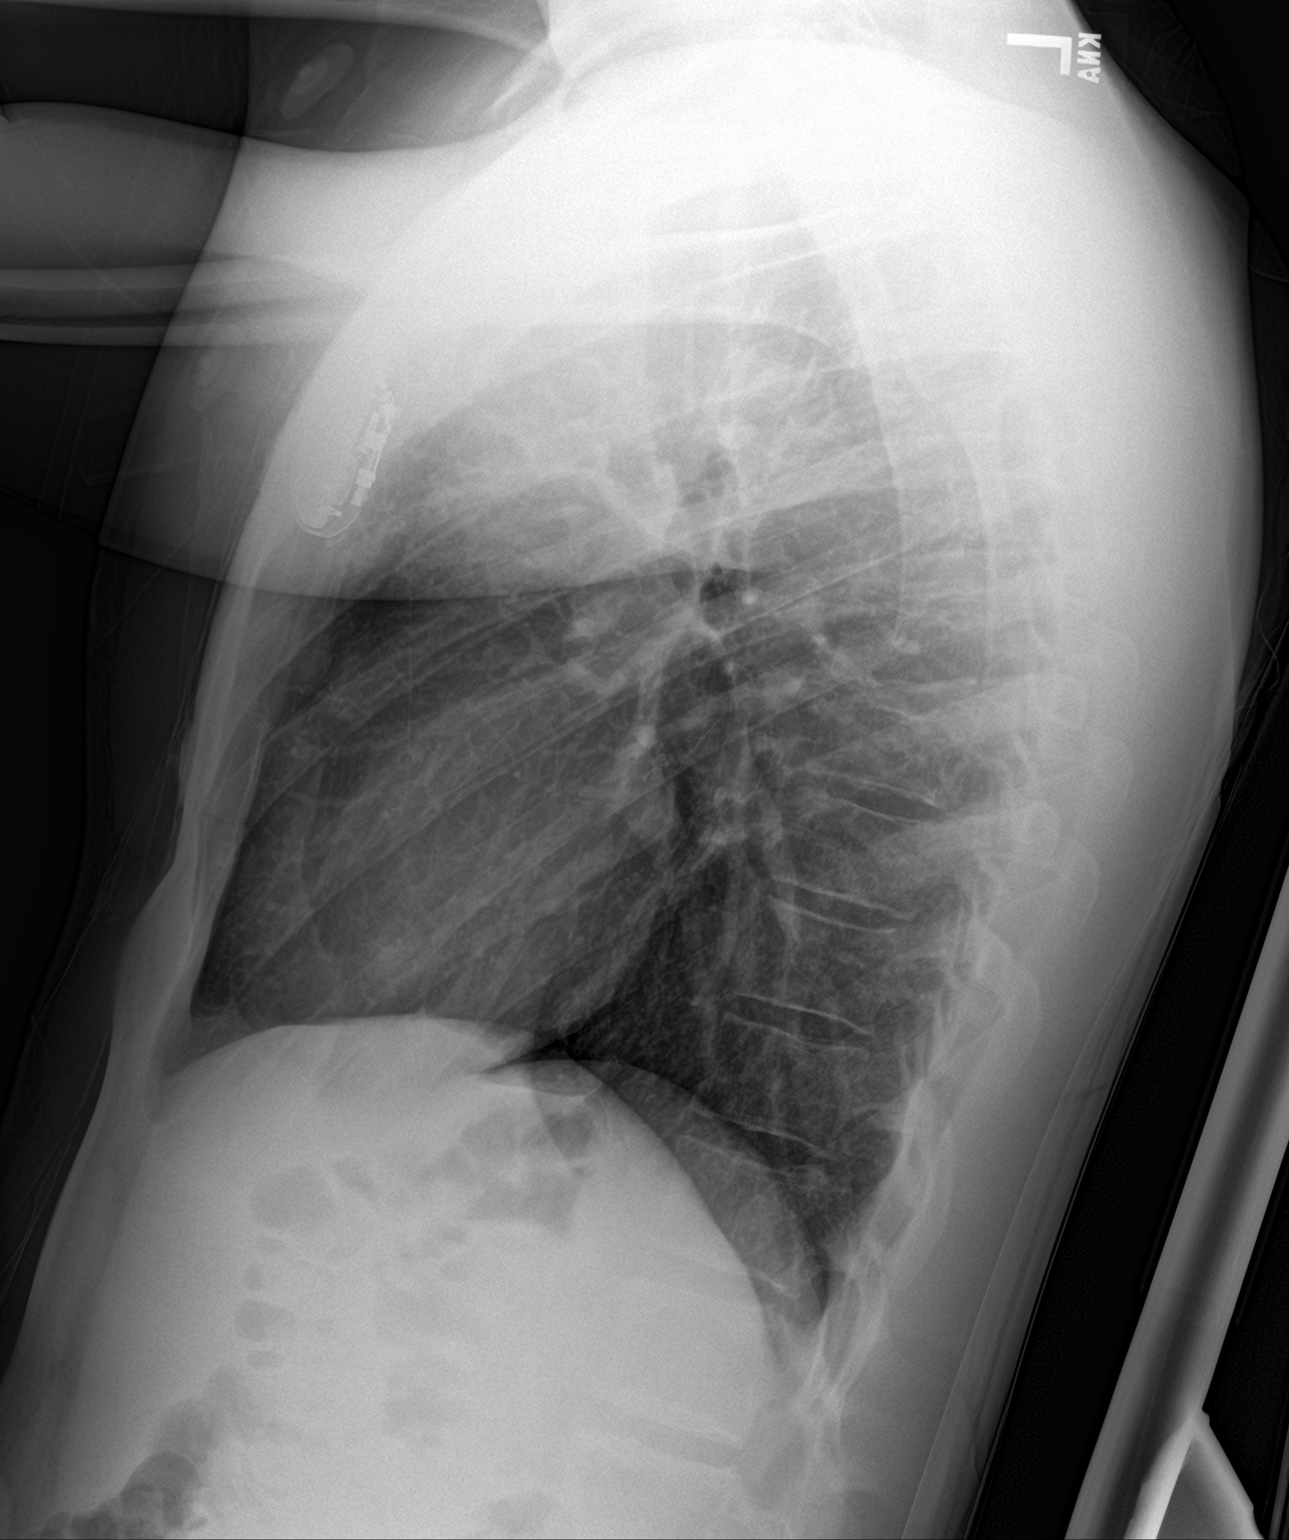

[chest ap]
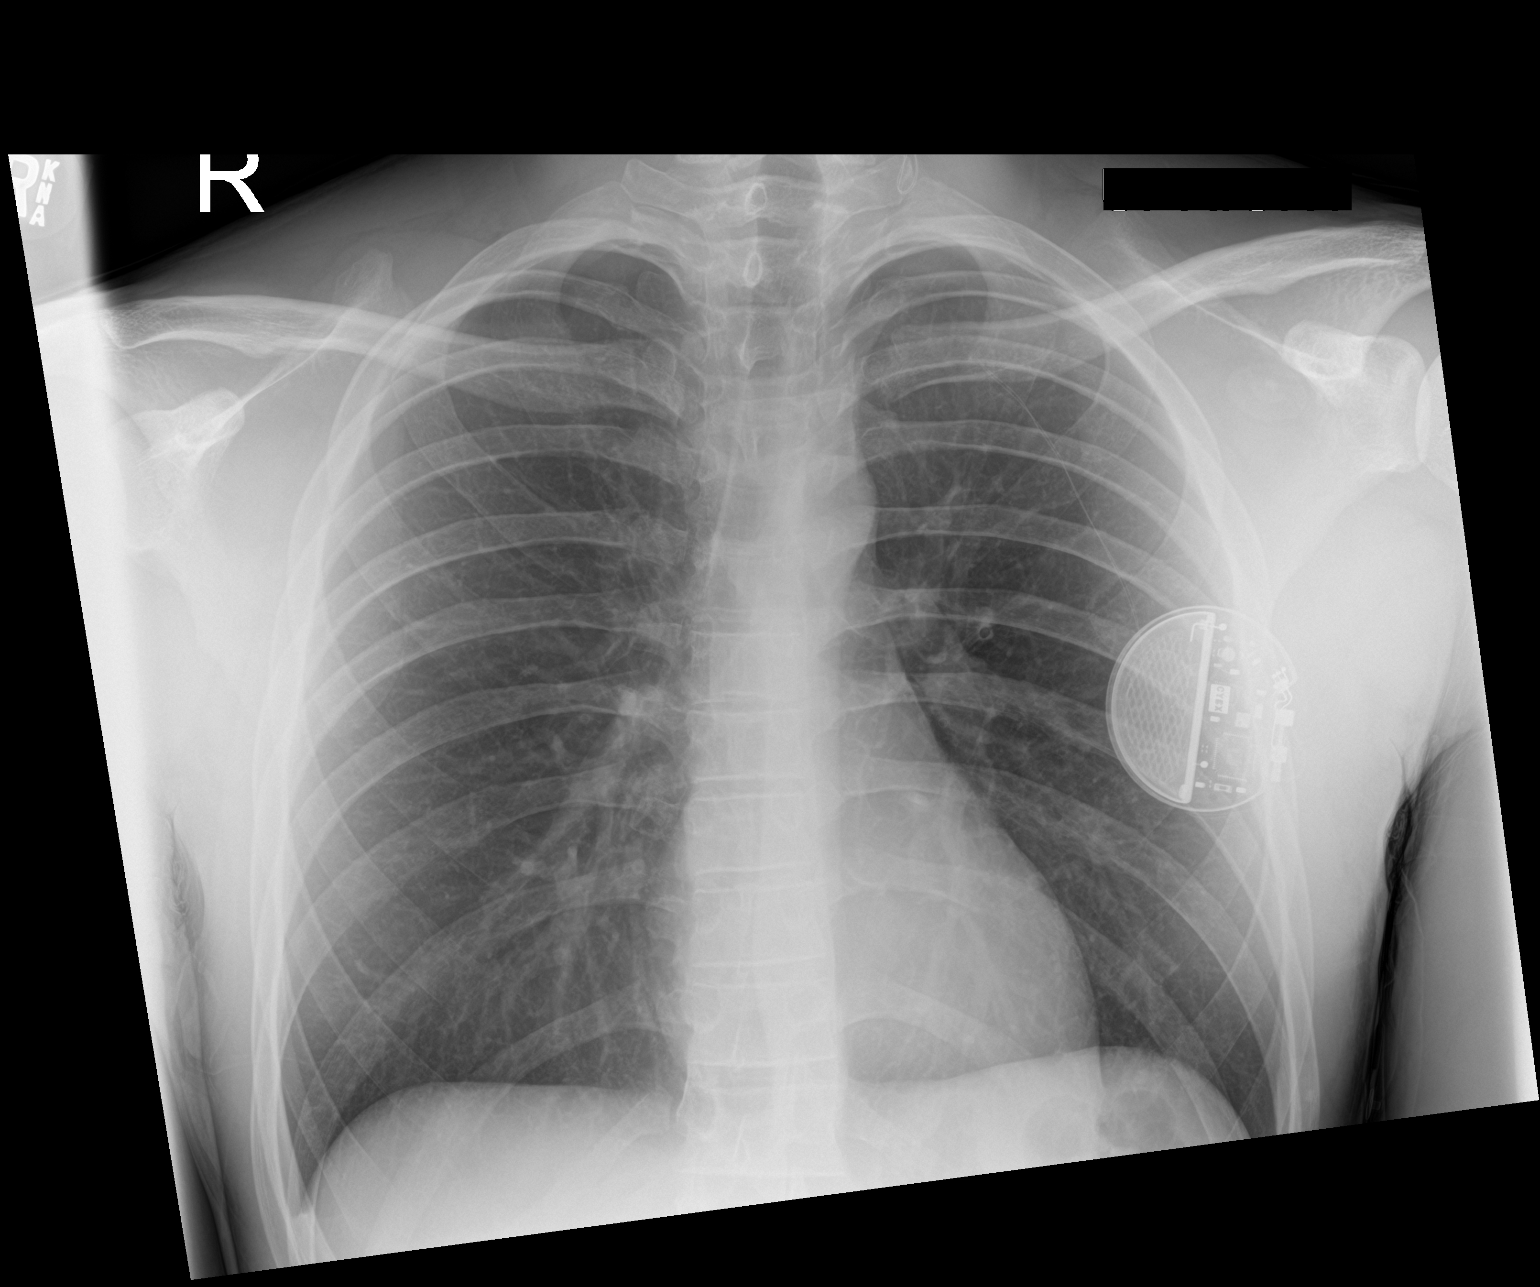

[2 of 2 positions shown; findings below may reference images not displayed]

FINDINGS: Neural stimulator in the LEFT cervical region with generator pack
projecting over LEFT chest.

Normal heart size, mediastinal contours, and pulmonary vascularity.

Minimal central peribronchial thickening.

Lungs mildly hyperinflated but clear.

No definite pulmonary infiltrate, pleural effusion or pneumothorax.

Osseous structures unremarkable.
IMPRESSION: Minimal peribronchial thickening and hyperinflation without acute
infiltrate.

## 2019-04-03 DIAGNOSIS — H5789 Other specified disorders of eye and adnexa: Secondary | ICD-10-CM | POA: Diagnosis not present

## 2019-06-17 ENCOUNTER — Other Ambulatory Visit: Payer: Self-pay | Admitting: Internal Medicine

## 2019-08-23 ENCOUNTER — Telehealth: Payer: Self-pay | Admitting: Student

## 2019-08-23 NOTE — Telephone Encounter (Signed)
Left detailed message stating that I would put a note that the patient can have 1 visitor with him to his appt. Instructed to call back with any questions or concerns.
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
## 2019-08-26 ENCOUNTER — Ambulatory Visit (INDEPENDENT_AMBULATORY_CARE_PROVIDER_SITE_OTHER): Payer: BC Managed Care – PPO | Admitting: Student

## 2019-08-26 ENCOUNTER — Other Ambulatory Visit: Payer: Self-pay

## 2019-08-26 VITALS — BP 140/84 | HR 55 | Ht 69.0 in | Wt 194.0 lb

## 2019-08-26 DIAGNOSIS — I959 Hypotension, unspecified: Secondary | ICD-10-CM

## 2019-08-26 DIAGNOSIS — R002 Palpitations: Secondary | ICD-10-CM | POA: Diagnosis not present

## 2019-08-26 DIAGNOSIS — E785 Hyperlipidemia, unspecified: Secondary | ICD-10-CM

## 2019-08-26 LAB — BASIC METABOLIC PANEL
BUN/Creatinine Ratio: 12 (ref 9–20)
BUN: 15 mg/dL (ref 6–20)
CO2: 22 mmol/L (ref 20–29)
Calcium: 9.7 mg/dL (ref 8.7–10.2)
Chloride: 103 mmol/L (ref 96–106)
Creatinine, Ser: 1.23 mg/dL (ref 0.76–1.27)
GFR calc Af Amer: 96 mL/min/{1.73_m2} (ref >59)
GFR calc non Af Amer: 83 mL/min/{1.73_m2} (ref >59)
Glucose: 94 mg/dL (ref 65–99)
Potassium: 4.5 mmol/L (ref 3.5–5.2)
Sodium: 139 mmol/L (ref 134–144)

## 2019-08-26 LAB — CBC
Hematocrit: 47.8 % (ref 37.5–51.0)
Hemoglobin: 16.8 g/dL (ref 13.0–17.7)
MCH: 30.6 pg (ref 26.6–33.0)
MCHC: 35.1 g/dL (ref 31.5–35.7)
MCV: 87 fL (ref 79–97)
Platelets: 301 10*3/uL (ref 150–450)
RBC: 5.49 x10E6/uL (ref 4.14–5.80)
RDW: 11.8 % (ref 11.6–15.4)
WBC: 6.2 10*3/uL (ref 3.4–10.8)

## 2019-08-26 LAB — LIPID PANEL
Chol/HDL Ratio: 9 ratio — ABNORMAL HIGH (ref 0.0–5.0)
Cholesterol, Total: 323 mg/dL — ABNORMAL HIGH (ref 100–199)
HDL: 36 mg/dL — ABNORMAL LOW (ref >39)
LDL Chol Calc (NIH): 264 mg/dL — ABNORMAL HIGH (ref 0–99)
Triglycerides: 124 mg/dL (ref 0–149)
VLDL Cholesterol Cal: 23 mg/dL (ref 5–40)

## 2019-08-26 NOTE — Patient Instructions (Signed)
Medication Instructions:   Your physician recommends that you continue on your current medications as directed. Please refer to the Current Medication list given to you today.  *If you need a refill on your cardiac medications before your next appointment, please call your pharmacy*  Lab Work: BMET CBC AND LIPIDS   If you have labs (blood work) drawn today and your tests are completely normal, you will receive your results only by: Marland Kitchen MyChart Message (if you have MyChart) OR . A paper copy in the mail If you have any lab test that is abnormal or we need to change your treatment, we will call you to review the results.  Testing/Procedures: NONE ORDERED  TODAY  Follow-Up: At [facility], you and your health needs are our priority.  As part of our continuing mission to provide you with exceptional heart care, we have created designated Provider Care Teams.  These Care Teams include your primary Cardiologist (physician) and Advanced Practice Providers (APPs -  Physician Assistants and Nurse Practitioners) who all work together to provide you with the care you need, when you need it.  Your next appointment:   1 year(s)  The format for your next appointment:   In Person  Provider:   You may see DR. Lovena Le  or one of the following Advanced Practice Providers on your designated Care Team:    Chanetta Marshall, NP  Tommye Standard, PA-C  Legrand Como "Oda Kilts, [place]   Other Instructions

## 2019-08-27 ENCOUNTER — Other Ambulatory Visit: Payer: Self-pay | Admitting: *Deleted

## 2019-08-27 DIAGNOSIS — E785 Hyperlipidemia, unspecified: Secondary | ICD-10-CM

## 2019-09-16 ENCOUNTER — Telehealth: Payer: Self-pay | Admitting: *Deleted

## 2019-09-16 DIAGNOSIS — E785 Hyperlipidemia, unspecified: Secondary | ICD-10-CM

## 2019-09-16 NOTE — Telephone Encounter (Signed)
Appears the referral needs to be placed.  Will place referral to our lipid clinic per Dr. Lovena Le and Oda Kilts PA-C, for elevated lipids.  Once referral placed will send a message to our [facility] schedulers, and/or Pharmacist, to call and arrange this appt.

## 2019-09-16 NOTE — Telephone Encounter (Signed)
RE: refer to lipid clinic per Oda Kilts and Dr. Lovena Le Received: Today Message Contents  Jerlyn Ly, LPN  Karlene Einstein the 1st order was placed on 08-27-19 and he has been called x 2 and has not responded back.

## 2019-09-16 NOTE — Telephone Encounter (Signed)
-----   Message from Shirley Friar, PA-C sent at 09/16/2019 10:32 AM EST ----- Can we please follow up on referral to [facility]?   Thank you!  Legrand Como [address] [place], PA-C  09/16/2019 10:32 AM

## 2019-09-20 NOTE — Telephone Encounter (Signed)
Received message from scheduling that they have left message 3x for pt to schedule lipid appt and pt has not returned calls.  I have also left a message for pt. Will await return call.

## 2019-09-24 ENCOUNTER — Encounter: Payer: Self-pay | Admitting: Student

## 2019-09-25 NOTE — Telephone Encounter (Signed)
Called cell, pt didn't answer and VM full. Called house #, spoke with pt's dad who states he accompanies pt to appts. Scheduled appt in lipid clinic on 1/4.

## 2019-10-07 ENCOUNTER — Ambulatory Visit (INDEPENDENT_AMBULATORY_CARE_PROVIDER_SITE_OTHER): Payer: 59 | Admitting: Pharmacist

## 2019-10-07 ENCOUNTER — Other Ambulatory Visit: Payer: Self-pay

## 2019-10-07 DIAGNOSIS — E782 Mixed hyperlipidemia: Secondary | ICD-10-CM | POA: Diagnosis not present

## 2019-10-07 DIAGNOSIS — E785 Hyperlipidemia, unspecified: Secondary | ICD-10-CM | POA: Insufficient documentation

## 2019-10-07 MED ORDER — ATORVASTATIN CALCIUM 40 MG PO TABS: 40.0000 mg | ORAL_TABLET | Freq: Every day | ORAL | 11 refills | Status: DC

## 2019-10-07 NOTE — Patient Instructions (Addendum)
It was nice to see you today  Your LDL cholesterol is currently 264 and your goal is < 130  Start taking atorvastatin (Lipitor) 40mg  each evening  Recheck fasting cholesterol on Wednesday, March 31st any time after 7:30am  Call April 02, Pharmacist with any questions #[phone]
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
## 2019-10-08 ENCOUNTER — Encounter: Payer: Self-pay | Admitting: General Practice

## 2020-01-01 ENCOUNTER — Other Ambulatory Visit: Payer: 59

## 2020-01-02 ENCOUNTER — Telehealth: Payer: Self-pay | Admitting: Pharmacist

## 2020-01-02 NOTE — Telephone Encounter (Signed)
Pt missed lab appt yesterday to check lipids on atorvastatin 40mg  daily. Called pt and left message, then spoke with his father. Rescheduled fasting lab appt for tomorrow AM.

## 2020-01-03 ENCOUNTER — Other Ambulatory Visit: Payer: 59 | Admitting: *Deleted

## 2020-01-03 ENCOUNTER — Other Ambulatory Visit: Payer: Self-pay

## 2020-01-03 DIAGNOSIS — E782 Mixed hyperlipidemia: Secondary | ICD-10-CM

## 2020-01-03 LAB — HEPATIC FUNCTION PANEL
ALT: 41 IU/L (ref 0–44)
AST: 29 IU/L (ref 0–40)
Albumin: 4.5 g/dL (ref 4.1–5.2)
Alkaline Phosphatase: 135 IU/L — ABNORMAL HIGH (ref 39–117)
Bilirubin Total: 0.4 mg/dL (ref 0.0–1.2)
Bilirubin, Direct: 0.12 mg/dL (ref 0.00–0.40)
Total Protein: 6.9 g/dL (ref 6.0–8.5)

## 2020-01-03 LAB — LIPID PANEL
Chol/HDL Ratio: 6.5 ratio — ABNORMAL HIGH (ref 0.0–5.0)
Cholesterol, Total: 203 mg/dL — ABNORMAL HIGH (ref 100–199)
HDL: 31 mg/dL — ABNORMAL LOW (ref >39)
LDL Chol Calc (NIH): 156 mg/dL — ABNORMAL HIGH (ref 0–99)
Triglycerides: 88 mg/dL (ref 0–149)
VLDL Cholesterol Cal: 16 mg/dL (ref 5–40)

## 2020-01-07 ENCOUNTER — Telehealth: Payer: Self-pay | Admitting: Pharmacist

## 2020-01-07 MED ORDER — ATORVASTATIN CALCIUM 80 MG PO TABS: 80.0000 mg | ORAL_TABLET | Freq: Every day | ORAL | 11 refills | Status: DC

## 2020-01-07 NOTE — Telephone Encounter (Signed)
Spoke with pt's father about lab results. Reports tolerating atorvastatin 40mg  daily well. LDL has improved 40% from to 156. He is agreeable to increasing dose to 80mg  daily. New rx sent in, he will have f/u labs checked at PCP in the next 1-2 months. Also mailed copy of lab results per request.

## 2020-03-19 ENCOUNTER — Other Ambulatory Visit: Payer: Self-pay | Admitting: Internal Medicine

## 2020-12-03 ENCOUNTER — Other Ambulatory Visit: Payer: Self-pay | Admitting: Internal Medicine

## 2021-03-24 ENCOUNTER — Ambulatory Visit: Payer: 59 | Admitting: Student

## 2021-03-24 ENCOUNTER — Encounter: Payer: Self-pay | Admitting: Student

## 2021-03-24 ENCOUNTER — Other Ambulatory Visit: Payer: Self-pay

## 2021-03-24 VITALS — BP 126/80 | HR 60 | Ht 69.0 in | Wt 201.0 lb

## 2021-03-24 DIAGNOSIS — R002 Palpitations: Secondary | ICD-10-CM | POA: Diagnosis not present

## 2021-03-24 DIAGNOSIS — E782 Mixed hyperlipidemia: Secondary | ICD-10-CM

## 2021-03-24 DIAGNOSIS — I10 Essential (primary) hypertension: Secondary | ICD-10-CM

## 2021-03-24 MED ORDER — ATENOLOL 25 MG PO TABS: 25.0000 mg | ORAL_TABLET | Freq: Every day | ORAL | 3 refills | Status: DC

## 2021-03-24 NOTE — Addendum Note (Signed)
Addended by: Cleda Mccreedy on: 03/24/2021 12:51 PM   Modules accepted: Orders
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
## 2021-03-24 NOTE — Patient Instructions (Signed)
Medication Instructions:  Your physician recommends that you continue on your current medications as directed. Please refer to the Current Medication list given to you today.  *If you need a refill on your cardiac medications before your next appointment, please call your pharmacy*   Lab Work: None If you have labs (blood work) drawn today and your tests are completely normal, you will receive your results only by: MyChart Message (if you have MyChart) OR A paper copy in the mail If you have any lab test that is abnormal or we need to change your treatment, we will call you to review the results.   Follow-Up: At [facility], you and your health needs are our priority.  As part of our continuing mission to provide you with exceptional heart care, we have created designated Provider Care Teams.  These Care Teams include your primary Cardiologist (physician) and Advanced Practice Providers (APPs -  Physician Assistants and Nurse Practitioners) who all work together to provide you with the care you need, when you need it.    Your next appointment:   1 year(s)  The format for your next appointment:   In Person  Provider:   You may see None Sharrell Ku, MD or one of the following Advanced Practice Providers on your designated Care Team:    Francis Dowse, [place] "[facility]" [place], [place]

## 2021-04-29 ENCOUNTER — Ambulatory Visit: Payer: 59 | Admitting: Cardiovascular Disease

## 2021-06-23 ENCOUNTER — Encounter: Payer: Self-pay | Admitting: Internal Medicine

## 2021-06-23 ENCOUNTER — Other Ambulatory Visit: Payer: Self-pay

## 2021-06-23 MED ORDER — ATENOLOL 25 MG PO TABS: 25.0000 mg | ORAL_TABLET | Freq: Every day | ORAL | 2 refills | Status: DC

## 2021-06-23 NOTE — Telephone Encounter (Signed)
Pt's medication was sent to pt's pharmacy as requested. Confirmation received.  °

## 2021-07-29 ENCOUNTER — Encounter: Payer: Self-pay | Admitting: Internal Medicine

## 2021-07-29 ENCOUNTER — Ambulatory Visit: Payer: 59 | Admitting: Internal Medicine

## 2021-07-29 ENCOUNTER — Telehealth: Payer: Self-pay

## 2021-07-29 VITALS — BP 118/64 | HR 70 | Ht 69.0 in | Wt 207.0 lb

## 2021-07-29 DIAGNOSIS — R7989 Other specified abnormal findings of blood chemistry: Secondary | ICD-10-CM

## 2021-07-29 DIAGNOSIS — K602 Anal fissure, unspecified: Secondary | ICD-10-CM

## 2021-07-29 MED ORDER — DILTIAZEM GEL 2 %
1.0000 | Freq: Every day | CUTANEOUS | 1 refills | Status: DC
Start: 2021-07-29 — End: 2022-05-06

## 2021-07-29 NOTE — Patient Instructions (Signed)
If you are age 24 or older, your body mass index should be between 23-30. Your Body mass index is 30.57 kg/m. If this is out of the aforementioned range listed, please consider follow up with your Primary Care Provider.  If you are age 72 or younger, your body mass index should be between 19-25. Your Body mass index is 30.57 kg/m. If this is out of the aformentioned range listed, please consider follow up with your Primary Care Provider.   ________________________________________________________  The [facility] providers would like to encourage you to use [facility] to communicate with providers for non-urgent requests or questions.  Due to long hold times on the telephone, sending your provider a message by [facility] may be a faster and more efficient way to get a response.  Please allow 48 business hours for a response.  Please remember that this is for non-urgent requests.  _______________________________________________________  Your provider has prescribed Diltiazem gel for you. Please follow the directions written on your prescription bottle or given to you specifically by your provider. (5 times daily) Since this is a specialty medication and is not readily available at most local pharmacies, we have sent your prescription to:  [facility] information is below: Address: [address] [place], [place] [address]  [phone]  *Please DO NOT go directly from our office to pick up this medication! Give the pharmacy 1 day to process the prescription as this is compounded and takes time to make.  Take 2 tablespoons of Metamucil daily in 12 ounces of water  Sitz baths
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
## 2021-07-29 NOTE — Telephone Encounter (Signed)
Hilarie Fredrickson, MD  Chrystie Nose, RN Bonita Quin,  I saw this patient today in the office for an anal fissure.  After he left, I reviewed blood work from his PCPs office.  I noticed that he had abnormal liver test.  I am not sure that this is being addressed.  Please contact patient and tell him that I would like him to have repeat liver tests.  If these remain elevated, he will need additional work-up.  Explain to him that this has nothing to do with the reason he saw me, but was something I noticed from outside blood work after he had left the office today.  Just want to be thorough.  Convert this to a phone note.  Thanks  Dr. Marina Goodell   Left message for pt to call back. Order in for labs.

## 2021-07-29 NOTE — Telephone Encounter (Signed)
-----   Message from Hilarie Fredrickson, MD sent at 07/29/2021 12:59 PM EDT ----- Regarding: Needs liver tests Bonita Quin, I saw this patient today in the office for an anal fissure. After he left, I reviewed blood work from his PCPs office.  I noticed that he had abnormal liver test.  I am not sure that this is being addressed. Please contact patient and tell him that I would like him to have repeat liver tests.  If these remain elevated, he will need additional work-up.  Explain to him that this has nothing to do with the reason he saw me, but was something I noticed from outside blood work after he had left the office today.  Just want to be thorough.  Convert this to a phone note.  Thanks Dr. Marina Goodell

## 2021-07-29 NOTE — Telephone Encounter (Signed)
Left message for pt to call back. Order in for labs.

## 2021-08-02 NOTE — Telephone Encounter (Signed)
Left message for pt to call back  °

## 2021-08-04 NOTE — Telephone Encounter (Signed)
Called and left another message for pt to come in for labs. Letter also mailed to pt as I have not been able to reach pt by phone.
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
## 2021-08-24 ENCOUNTER — Other Ambulatory Visit: Payer: Self-pay

## 2021-08-24 ENCOUNTER — Ambulatory Visit (INDEPENDENT_AMBULATORY_CARE_PROVIDER_SITE_OTHER): Payer: 59 | Admitting: Cardiovascular Disease

## 2021-08-24 ENCOUNTER — Encounter: Payer: Self-pay | Admitting: Cardiovascular Disease

## 2021-08-24 VITALS — BP 132/68 | HR 49 | Ht 69.0 in | Wt 201.6 lb

## 2021-08-24 DIAGNOSIS — E782 Mixed hyperlipidemia: Secondary | ICD-10-CM | POA: Diagnosis not present

## 2021-08-24 NOTE — Patient Instructions (Signed)
Medication Instructions:  The current medical regimen is effective;  continue present plan and medications.  *If you need a refill on your cardiac medications before your next appointment, please call your pharmacy*   Follow-Up: At [facility], you and your health needs are our priority.  As part of our continuing mission to provide you with exceptional heart care, we have created designated Provider Care Teams.  These Care Teams include your primary Cardiologist (physician) and Advanced Practice Providers (APPs -  Physician Assistants and Nurse Practitioners) who all work together to provide you with the care you need, when you need it.  We recommend signing up for the patient portal called "MyChart".  Sign up information is provided on this After Visit Summary.  MyChart is used to connect with patients for Virtual Visits (Telemedicine).  Patients are able to view lab/test results, encounter notes, upcoming appointments, etc.  Non-urgent messages can be sent to your provider as well.   To learn more about what you can do with MyChart, go to [email or web address].    Your next appointment:   2 year(s)  The format for your next appointment:   In Person  Provider:   Pomfret O'Neal, MD    

## 2021-08-30 ENCOUNTER — Other Ambulatory Visit: Payer: Self-pay

## 2021-08-30 MED ORDER — ATORVASTATIN CALCIUM 80 MG PO TABS: 80.0000 mg | ORAL_TABLET | Freq: Every day | ORAL | 2 refills | Status: DC

## 2021-09-02 ENCOUNTER — Telehealth: Payer: Self-pay | Admitting: Internal Medicine

## 2021-09-02 NOTE — Telephone Encounter (Signed)
Inbound call from patient. States he have been using diltiazem and it has been working well for him. States yesterday he went to the gym and developed a hemorrhoid and would like to know if diltiazem will help with that as well

## 2021-09-02 NOTE — Telephone Encounter (Signed)
Spoke with pt and let him know he needs to try prep h creams and supp otc and avoid lifting wts for the hemorrhoid to improve. Also let him know he can try sitz bathes. Pt verbalized understanding.

## 2021-09-18 ENCOUNTER — Other Ambulatory Visit: Payer: Self-pay | Admitting: Student

## 2022-04-21 DIAGNOSIS — R7301 Impaired fasting glucose: Secondary | ICD-10-CM | POA: Diagnosis not present

## 2022-04-21 DIAGNOSIS — I10 Essential (primary) hypertension: Secondary | ICD-10-CM | POA: Diagnosis not present

## 2022-04-21 DIAGNOSIS — E559 Vitamin D deficiency, unspecified: Secondary | ICD-10-CM | POA: Diagnosis not present

## 2022-04-26 DIAGNOSIS — Z0000 Encounter for general adult medical examination without abnormal findings: Secondary | ICD-10-CM | POA: Diagnosis not present

## 2022-04-26 DIAGNOSIS — I10 Essential (primary) hypertension: Secondary | ICD-10-CM | POA: Diagnosis not present

## 2022-04-26 DIAGNOSIS — R82998 Other abnormal findings in urine: Secondary | ICD-10-CM | POA: Diagnosis not present

## 2022-04-26 DIAGNOSIS — G40909 Epilepsy, unspecified, not intractable, without status epilepticus: Secondary | ICD-10-CM | POA: Diagnosis not present

## 2022-04-26 DIAGNOSIS — Z1331 Encounter for screening for depression: Secondary | ICD-10-CM | POA: Diagnosis not present

## 2022-05-05 ENCOUNTER — Telehealth: Payer: Self-pay | Admitting: Internal Medicine

## 2022-05-05 NOTE — Telephone Encounter (Signed)
Patient called requesting a refill for the Diltiazem 2% gel.  He also asked which pharmacy it would be sent to.  He said he thought the last time it was sent to somewhere over in [facility].  Please call patient and advise.  Thank you.

## 2022-05-06 MED ORDER — DILTIAZEM GEL 2 %: 1.0000 | Freq: Every day | CUTANEOUS | 1 refills | Status: DC

## 2022-05-06 NOTE — Telephone Encounter (Signed)
Lm on vm that I send Diltiazem to [facility] at [facility]

## 2022-05-13 ENCOUNTER — Other Ambulatory Visit: Payer: Self-pay | Admitting: Internal Medicine

## 2022-08-08 ENCOUNTER — Other Ambulatory Visit: Payer: Self-pay | Admitting: Internal Medicine

## 2022-09-04 ENCOUNTER — Other Ambulatory Visit: Payer: Self-pay | Admitting: Internal Medicine

## 2022-09-20 ENCOUNTER — Other Ambulatory Visit: Payer: Self-pay | Admitting: Internal Medicine

## 2022-11-29 ENCOUNTER — Ambulatory Visit
Admission: EM | Admit: 2022-11-29 | Discharge: 2022-11-29 | Disposition: A | Discharge status: Home / Self Care | Payer: BC Managed Care – PPO | Attending: Urgent Care | Admitting: Urgent Care

## 2022-11-29 ENCOUNTER — Ambulatory Visit (INDEPENDENT_AMBULATORY_CARE_PROVIDER_SITE_OTHER): Payer: BC Managed Care – PPO

## 2022-11-29 ENCOUNTER — Other Ambulatory Visit: Payer: BC Managed Care – PPO

## 2022-11-29 DIAGNOSIS — S97112A Crushing injury of left great toe, initial encounter: Secondary | ICD-10-CM | POA: Diagnosis not present

## 2022-11-29 DIAGNOSIS — M7989 Other specified soft tissue disorders: Secondary | ICD-10-CM | POA: Diagnosis not present
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
## 2022-11-29 NOTE — Discharge Instructions (Addendum)
Follow up here or with your primary care provider if your symptoms are worsening or not improving.    

## 2022-11-29 NOTE — ED Triage Notes (Signed)
Patient presents to UC for left big toe injury today.States he dropped a 45lb plate on his toe. Taking tylenol for pain.

## 2023-06-09 DIAGNOSIS — E559 Vitamin D deficiency, unspecified: Secondary | ICD-10-CM | POA: Diagnosis not present

## 2023-06-09 DIAGNOSIS — E785 Hyperlipidemia, unspecified: Secondary | ICD-10-CM | POA: Diagnosis not present

## 2023-06-09 DIAGNOSIS — R7301 Impaired fasting glucose: Secondary | ICD-10-CM | POA: Diagnosis not present

## 2023-06-14 DIAGNOSIS — I10 Essential (primary) hypertension: Secondary | ICD-10-CM | POA: Diagnosis not present

## 2023-06-14 DIAGNOSIS — R82998 Other abnormal findings in urine: Secondary | ICD-10-CM | POA: Diagnosis not present

## 2023-06-14 DIAGNOSIS — Z0000 Encounter for general adult medical examination without abnormal findings: Secondary | ICD-10-CM | POA: Diagnosis not present

## 2023-06-14 DIAGNOSIS — G40909 Epilepsy, unspecified, not intractable, without status epilepticus: Secondary | ICD-10-CM | POA: Diagnosis not present

## 2023-06-21 ENCOUNTER — Ambulatory Visit: Payer: BC Managed Care – PPO | Attending: Physician Assistant | Admitting: Physician Assistant

## 2023-06-21 ENCOUNTER — Encounter: Payer: Self-pay | Admitting: Physician Assistant

## 2023-06-21 VITALS — BP 130/80 | HR 66 | Ht 69.0 in | Wt 217.0 lb

## 2023-06-21 DIAGNOSIS — E7801 Familial hypercholesterolemia: Secondary | ICD-10-CM

## 2023-06-21 DIAGNOSIS — I341 Nonrheumatic mitral (valve) prolapse: Secondary | ICD-10-CM

## 2023-06-21 DIAGNOSIS — R0602 Shortness of breath: Secondary | ICD-10-CM | POA: Diagnosis not present

## 2023-06-21 DIAGNOSIS — R002 Palpitations: Secondary | ICD-10-CM

## 2023-06-21 NOTE — Patient Instructions (Signed)
Medication Instructions:  Your physician recommends that you continue on your current medications as directed. Please refer to the Current Medication list given to you today.  *If you need a refill on your cardiac medications before your next appointment, please call your pharmacy*   Lab Work: None ordered  If you have labs (blood work) drawn today and your tests are completely normal, you will receive your results only by: MyChart Message (if you have MyChart) OR A paper copy in the mail If you have any lab test that is abnormal or we need to change your treatment, we will call you to review the results.   Testing/Procedures: Your physician has requested that you have an echocardiogram. Echocardiography is a painless test that uses sound waves to create images of your heart. It provides your doctor with information about the size and shape of your heart and how well your heart's chambers and valves are working. This procedure takes approximately one hour. There are no restrictions for this procedure. Please do NOT wear cologne, perfume, aftershave, or lotions (deodorant is allowed). Please arrive 15 minutes prior to your appointment time.    Follow-Up: At [facility], you and your health needs are our priority.  As part of our continuing mission to provide you with exceptional heart care, we have created designated Provider Care Teams.  These Care Teams include your primary Cardiologist (physician) and Advanced Practice Providers (APPs -  Physician Assistants and Nurse Practitioners) who all work together to provide you with the care you need, when you need it.  We recommend signing up for the patient portal called "MyChart".  Sign up information is provided on this After Visit Summary.  MyChart is used to connect with patients for Virtual Visits (Telemedicine).  Patients are able to view lab/test results, encounter notes, upcoming appointments, etc.  Non-urgent messages can be  sent to your provider as well.   To learn more about what you can do with MyChart, go to [email or web address].    Your next appointment:   12 month(s)  Provider:   Reatha Harps, MD     Other Instructions

## 2023-06-21 NOTE — Progress Notes (Signed)
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 male who returns for follow up of hyperlipidemia. His hyperlipidemia is thought to be familial, with a previous LDL level of 264. The patient has been on 20mg  of Lipitor for approximately a year to a year and a half, after experiencing severe headaches with the 80mg  and 40 mg dose. Recently, the patient's primary care physician prescribed Zetia. He notes shortness of breath and the need to take deep breaths, particularly during exercise. These episodes have been occurring for the past couple of months. The patient reports feeling short of breath after about five minutes on the [employer], with a heart rate in the 160s. The patient also reports occasional rapid heartbeats, lasting for a few seconds. He notes his palpitations are overall stable. The patient denies any chest pain, passing out, or swelling in the legs. The patient does not smoke. The patient has a family history of heart disease, with a grandfather who had a bypass surgery in his mid to late 40s.   ROS:  See HPI No claudication     Studies Reviewed:   EKG Interpretation Date/Time:  Wednesday June 21 2023 15:38:06 EDT Ventricular Rate:  66 PR Interval:  164 QRS Duration:  86 QT Interval:  368 QTC Calculation: 385 R Axis:   65  Text Interpretation: Normal sinus rhythm Normal ECG Confirmed by Tereso Newcomer [phone]) on 06/21/2023 3:43:14 PM    Risk Assessment/Calculations:             Physical Exam:   VS:  BP 130/80   Pulse 66   Ht 5\' 9"  (1.753 m)   Wt 217 lb  (98.4 kg)   SpO2 98%   BMI 32.05 kg/m    Wt Readings from Last 3 Encounters:  06/21/23 217 lb (98.4 kg)  08/24/21 201 lb 9.6 oz (91.4 kg)  07/29/21 207 lb (93.9 kg)    Constitutional:      Appearance: Healthy appearance. Not in distress.  Neck:     Vascular: No carotid bruit. JVD normal.  Pulmonary:     Breath sounds: Normal breath sounds. No wheezing. No rales.  Cardiovascular:     Normal rate. Regular rhythm.     Murmurs: There is no murmur.  Edema:    Peripheral edema absent.  Abdominal:     Palpations: Abdomen is soft.        Assessment and Plan:     Familial Hyperlipidemia LDL cholesterol significantly elevated (199) on recent labs with PCP. Patient currently on Lipitor 20mg  daily due to intolerance of higher dose. Recently started on Zetia by primary care physician. Discussed potential future treatment options including Repatha or Praluent based upon lipid control.  -Continue Lipitor 20mg  daily and Zetia 10 mg as prescribed by primary care physician. -Primary care physician plans to recheck lipid panel in 6 months. -Consider referral to lipid specialist or PharmD lipid clinic if LDL remains elevated. -Would strongly consider PCSK9 inhibitor if Lp(a) is elevated.   Mitral Valve  Prolapse Last echocardiogram in 2019 showed mitral valve prolapse with mild mitral regurgitation. Patient reports onset of shortness of breath with exertion. However, he is very fit. He exercises on a regular basis without anginal symptoms. However, I think it would be good to update his echocardiogram to recheck his MV.  -Order repeat echocardiogram to assess current status of mitral valve and rule out progression of mitral regurgitation. -Continue current level of exercise unless symptoms worsen.  Palpitations Patient has history of palpitations, currently controlled on Atenolol. Reports occasional episodes of perceived rapid heart rate lasting a few seconds. This has been ongoing for years w/o  significant change.  -Continue Atenolol 25 mg once daily           Dispo:  Return in about 1 year (around 06/20/2024) for Routine Follow Up w/ Dr. Flora Lipps.  Signed, Tereso Newcomer, PA-C

## 2023-07-07 ENCOUNTER — Ambulatory Visit (HOSPITAL_COMMUNITY): Payer: BC Managed Care – PPO | Attending: Physician Assistant

## 2023-07-07 DIAGNOSIS — I341 Nonrheumatic mitral (valve) prolapse: Secondary | ICD-10-CM

## 2023-07-07 DIAGNOSIS — I340 Nonrheumatic mitral (valve) insufficiency: Secondary | ICD-10-CM

## 2023-07-07 LAB — ECHOCARDIOGRAM COMPLETE
AR max vel: 3.88 cm2
AV Area VTI: 3.53 cm2
AV Area mean vel: 3.63 cm2
AV Mean grad: 3.5 mm[Hg]
AV Peak grad: 6.4 mm[Hg]
Ao pk vel: 1.26 m/s
Area-P 1/2: 3.34 cm2
S' Lateral: 3.4 cm

## 2023-07-11 ENCOUNTER — Encounter: Payer: Self-pay | Admitting: Physician Assistant

## 2023-07-11 DIAGNOSIS — I340 Nonrheumatic mitral (valve) insufficiency: Secondary | ICD-10-CM

## 2023-07-11 HISTORY — DX: Nonrheumatic mitral (valve) insufficiency: I34.0

## 2023-07-12 ENCOUNTER — Telehealth: Payer: Self-pay | Admitting: *Deleted

## 2023-07-12 DIAGNOSIS — I341 Nonrheumatic mitral (valve) prolapse: Secondary | ICD-10-CM

## 2023-07-12 NOTE — Telephone Encounter (Signed)
-----   Message from Tereso Newcomer sent at 07/11/2023  2:46 PM EDT ----- I will send a copy to Rodrigo Ran, MD as Lorain Childes. Echocardiogram shows normal ejection fraction (heart function). There is mild mitral valve prolapse with mild mitral regurgitation. There is also mild enlargement of the upper left chamber (left atrial enlargement). No change in management needed at this time. But we do need to continue to monitor his mitral regurgitation. PLAN:  -Repeat echocardiogram in 1 year (Dx: mitral valve prolapse with mitral regurgitation) - before follow up appt with Dr. Flora Lipps. Tereso Newcomer, PA-C    07/11/2023 2:37 PM

## 2023-08-22 ENCOUNTER — Ambulatory Visit (HOSPITAL_BASED_OUTPATIENT_CLINIC_OR_DEPARTMENT_OTHER): Payer: BC Managed Care – PPO

## 2023-08-22 ENCOUNTER — Ambulatory Visit (HOSPITAL_BASED_OUTPATIENT_CLINIC_OR_DEPARTMENT_OTHER): Payer: BC Managed Care – PPO | Admitting: Student

## 2023-08-22 ENCOUNTER — Encounter (HOSPITAL_BASED_OUTPATIENT_CLINIC_OR_DEPARTMENT_OTHER): Payer: Self-pay | Admitting: Student

## 2023-08-22 DIAGNOSIS — G8929 Other chronic pain: Secondary | ICD-10-CM

## 2023-08-22 DIAGNOSIS — M25562 Pain in left knee: Secondary | ICD-10-CM

## 2023-08-22 DIAGNOSIS — M222X2 Patellofemoral disorders, left knee: Secondary | ICD-10-CM | POA: Diagnosis not present
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
## 2023-08-29 ENCOUNTER — Ambulatory Visit (HOSPITAL_BASED_OUTPATIENT_CLINIC_OR_DEPARTMENT_OTHER): Payer: BC Managed Care – PPO | Admitting: Student

## 2023-08-29 ENCOUNTER — Ambulatory Visit: Payer: BC Managed Care – PPO | Admitting: Orthopaedic Surgery

## 2024-06-13 ENCOUNTER — Encounter: Payer: Self-pay | Admitting: Cardiovascular Disease

## 2024-07-04 ENCOUNTER — Ambulatory Visit (HOSPITAL_COMMUNITY)
Admission: RE | Admit: 2024-07-04 | Discharge: 2024-07-04 | Disposition: A | Discharge status: Home / Self Care | Source: Ambulatory Visit | Attending: Cardiology | Admitting: Cardiology

## 2024-07-04 DIAGNOSIS — I341 Nonrheumatic mitral (valve) prolapse: Secondary | ICD-10-CM

## 2024-07-04 LAB — ECHOCARDIOGRAM COMPLETE
Area-P 1/2: 2.97 cm2
S' Lateral: 3.6 cm

## 2024-07-05 ENCOUNTER — Ambulatory Visit: Payer: Self-pay | Admitting: Physician Assistant

## 2024-07-12 ENCOUNTER — Ambulatory Visit

## 2024-07-12 DIAGNOSIS — M76892 Other specified enthesopathies of left lower limb, excluding foot: Secondary | ICD-10-CM | POA: Diagnosis not present

## 2024-07-12 NOTE — Patient Instructions (Signed)
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
## 2024-08-05 ENCOUNTER — Encounter: Payer: Self-pay | Admitting: Radiology

## 2024-08-23 ENCOUNTER — Ambulatory Visit
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
## 2024-09-09 DIAGNOSIS — T859XXA Unspecified complication of internal prosthetic device, implant and graft, initial encounter: Secondary | ICD-10-CM | POA: Diagnosis not present

## 2024-09-09 DIAGNOSIS — Z4542 Encounter for adjustment and management of neuropacemaker (brain) (peripheral nerve) (spinal cord): Secondary | ICD-10-CM | POA: Diagnosis not present
